# Patient Record
Sex: Female | Born: 1945 | Race: White | Hispanic: No | Marital: Single
Health system: Southern US, Community
[De-identification: ages and names within clinical notes are randomized; demographics above are authoritative.]

---

## 1999-06-09 ENCOUNTER — Other Ambulatory Visit: Admission: RE | Admit: 1999-06-09 | Discharge: 1999-06-09 | Payer: Self-pay | Admitting: Family Medicine

## 1999-08-23 ENCOUNTER — Inpatient Hospital Stay (HOSPITAL_COMMUNITY): Admission: AD | Admit: 1999-08-23 | Discharge: 1999-08-26 | Payer: Self-pay | Admitting: Internal Medicine

## 1999-08-24 ENCOUNTER — Encounter: Payer: Self-pay | Admitting: Internal Medicine

## 2001-08-13 ENCOUNTER — Other Ambulatory Visit: Admission: RE | Admit: 2001-08-13 | Discharge: 2001-08-13 | Payer: Self-pay | Admitting: Family Medicine

## 2001-08-15 ENCOUNTER — Encounter: Admission: RE | Admit: 2001-08-15 | Discharge: 2001-08-15 | Payer: Self-pay | Admitting: Family Medicine

## 2001-08-15 ENCOUNTER — Encounter: Payer: Self-pay | Admitting: Family Medicine

## 2011-12-24 ENCOUNTER — Observation Stay: Payer: Self-pay | Admitting: Internal Medicine

## 2014-02-14 ENCOUNTER — Observation Stay: Payer: Self-pay | Admitting: Internal Medicine

## 2014-02-14 LAB — URINALYSIS, COMPLETE
BACTERIA: NONE SEEN
BILIRUBIN, UR: NEGATIVE
Glucose,UR: >=500 mg/dL (ref 0–75)
Leukocyte Esterase: NEGATIVE
Nitrite: NEGATIVE
Ph: 6 (ref 4.5–8.0)
Protein: 100
RBC,UR: 1 /HPF (ref 0–5)
SPECIFIC GRAVITY: 1.018 (ref 1.003–1.030)
SQUAMOUS EPITHELIAL: NONE SEEN

## 2014-02-14 LAB — COMPREHENSIVE METABOLIC PANEL
Albumin: 3.8 g/dL (ref 3.4–5.0)
Alkaline Phosphatase: 253 U/L — ABNORMAL HIGH
Anion Gap: 10 (ref 7–16)
BUN: 26 mg/dL — ABNORMAL HIGH (ref 7–18)
Bilirubin,Total: 0.7 mg/dL (ref 0.2–1.0)
Calcium, Total: 10 mg/dL (ref 8.5–10.1)
Chloride: 103 mmol/L (ref 98–107)
Co2: 24 mmol/L (ref 21–32)
Creatinine: 1.51 mg/dL — ABNORMAL HIGH (ref 0.60–1.30)
EGFR (African American): 40 — ABNORMAL LOW
EGFR (Non-African Amer.): 35 — ABNORMAL LOW
Glucose: 449 mg/dL — ABNORMAL HIGH (ref 65–99)
Osmolality: 298 (ref 275–301)
Potassium: 3.6 mmol/L (ref 3.5–5.1)
SGOT(AST): 27 U/L (ref 15–37)
SGPT (ALT): 33 U/L (ref 12–78)
Sodium: 137 mmol/L (ref 136–145)
Total Protein: 8 g/dL (ref 6.4–8.2)

## 2014-02-14 LAB — CBC
HCT: 41.7 % (ref 35.0–47.0)
HGB: 13.7 g/dL (ref 12.0–16.0)
MCH: 28.8 pg (ref 26.0–34.0)
MCHC: 32.8 g/dL (ref 32.0–36.0)
MCV: 88 fL (ref 80–100)
Platelet: 212 10*3/uL (ref 150–440)
RBC: 4.74 10*6/uL (ref 3.80–5.20)
RDW: 16.8 % — ABNORMAL HIGH (ref 11.5–14.5)
WBC: 6.6 10*3/uL (ref 3.6–11.0)

## 2014-02-14 LAB — HEMOGLOBIN A1C: Hemoglobin A1C: 9.8 % — ABNORMAL HIGH (ref 4.2–6.3)

## 2014-02-14 LAB — TROPONIN I: Troponin-I: <0.02 ng/mL

## 2014-02-15 LAB — BASIC METABOLIC PANEL
ANION GAP: 5 — AB (ref 7–16)
BUN: 21 mg/dL — ABNORMAL HIGH (ref 7–18)
CHLORIDE: 106 mmol/L (ref 98–107)
CO2: 27 mmol/L (ref 21–32)
CREATININE: 1.13 mg/dL (ref 0.60–1.30)
Calcium, Total: 8.7 mg/dL (ref 8.5–10.1)
EGFR (African American): 57 — ABNORMAL LOW
EGFR (Non-African Amer.): 50 — ABNORMAL LOW
GLUCOSE: 274 mg/dL — AB (ref 65–99)
Osmolality: 288 (ref 275–301)
POTASSIUM: 3.4 mmol/L — AB (ref 3.5–5.1)
Sodium: 138 mmol/L (ref 136–145)

## 2014-02-15 LAB — CBC WITH DIFFERENTIAL/PLATELET
BASOS ABS: 0 10*3/uL (ref 0.0–0.1)
Basophil %: 0.6 %
Eosinophil #: 0.3 10*3/uL (ref 0.0–0.7)
Eosinophil %: 4.2 %
HCT: 34.5 % — AB (ref 35.0–47.0)
HGB: 11.5 g/dL — ABNORMAL LOW (ref 12.0–16.0)
Lymphocyte #: 2.1 10*3/uL (ref 1.0–3.6)
Lymphocyte %: 26.5 %
MCH: 28.7 pg (ref 26.0–34.0)
MCHC: 33.3 g/dL (ref 32.0–36.0)
MCV: 86 fL (ref 80–100)
Monocyte #: 0.6 x10 3/mm (ref 0.2–0.9)
Monocyte %: 7.4 %
NEUTROS ABS: 4.8 10*3/uL (ref 1.4–6.5)
NEUTROS PCT: 61.3 %
PLATELETS: 168 10*3/uL (ref 150–440)
RBC: 3.99 10*6/uL (ref 3.80–5.20)
RDW: 16.3 % — AB (ref 11.5–14.5)
WBC: 7.8 10*3/uL (ref 3.6–11.0)

## 2014-08-05 LAB — COMPREHENSIVE METABOLIC PANEL
Albumin: 3.4 g/dL (ref 3.4–5.0)
Alkaline Phosphatase: 254 U/L — ABNORMAL HIGH
Anion Gap: 13 (ref 7–16)
BUN: 30 mg/dL — ABNORMAL HIGH (ref 7–18)
Bilirubin,Total: 0.4 mg/dL (ref 0.2–1.0)
Calcium, Total: 9.3 mg/dL (ref 8.5–10.1)
Chloride: 91 mmol/L — ABNORMAL LOW (ref 98–107)
Co2: 23 mmol/L (ref 21–32)
Creatinine: 1.92 mg/dL — ABNORMAL HIGH (ref 0.60–1.30)
EGFR (African American): 30 — ABNORMAL LOW
EGFR (Non-African Amer.): 26 — ABNORMAL LOW
Glucose: 584 mg/dL (ref 65–99)
Osmolality: 288 (ref 275–301)
Potassium: 4.6 mmol/L (ref 3.5–5.1)
SGOT(AST): 22 U/L (ref 15–37)
SGPT (ALT): 32 U/L
Sodium: 127 mmol/L — ABNORMAL LOW (ref 136–145)
Total Protein: 7 g/dL (ref 6.4–8.2)

## 2014-08-05 LAB — CBC
HCT: 35.8 % (ref 35.0–47.0)
HGB: 11.6 g/dL — ABNORMAL LOW (ref 12.0–16.0)
MCH: 31.2 pg (ref 26.0–34.0)
MCHC: 32.4 g/dL (ref 32.0–36.0)
MCV: 96 fL (ref 80–100)
Platelet: 237 10*3/uL (ref 150–440)
RBC: 3.72 10*6/uL — ABNORMAL LOW (ref 3.80–5.20)
RDW: 15.7 % — ABNORMAL HIGH (ref 11.5–14.5)
WBC: 6.9 10*3/uL (ref 3.6–11.0)

## 2014-08-05 LAB — URINALYSIS, COMPLETE
Bacteria: NONE SEEN
Bilirubin,UR: NEGATIVE
Glucose,UR: >=500 mg/dL (ref 0–75)
Leukocyte Esterase: NEGATIVE
Nitrite: NEGATIVE
Ph: 5 (ref 4.5–8.0)
Protein: 30
RBC,UR: <1 /HPF (ref 0–5)
Specific Gravity: 1.023 (ref 1.003–1.030)
Squamous Epithelial: NONE SEEN
WBC UR: NONE SEEN /HPF (ref 0–5)

## 2014-08-06 ENCOUNTER — Observation Stay: Payer: Self-pay | Admitting: Specialist

## 2014-08-06 LAB — BASIC METABOLIC PANEL
Anion Gap: 11 (ref 7–16)
BUN: 24 mg/dL — ABNORMAL HIGH (ref 7–18)
Calcium, Total: 8.2 mg/dL — ABNORMAL LOW (ref 8.5–10.1)
Chloride: 103 mmol/L (ref 98–107)
Co2: 22 mmol/L (ref 21–32)
Creatinine: 1.57 mg/dL — ABNORMAL HIGH (ref 0.60–1.30)
EGFR (African American): 38 — ABNORMAL LOW
EGFR (Non-African Amer.): 33 — ABNORMAL LOW
Glucose: 163 mg/dL — ABNORMAL HIGH (ref 65–99)
Osmolality: 280 (ref 275–301)
Potassium: 3.8 mmol/L (ref 3.5–5.1)
Sodium: 136 mmol/L (ref 136–145)

## 2014-08-06 LAB — HEPATIC FUNCTION PANEL A (ARMC)
Albumin: 2.9 g/dL — ABNORMAL LOW (ref 3.4–5.0)
Alkaline Phosphatase: 209 U/L — ABNORMAL HIGH
Bilirubin, Direct: <0.1 mg/dL (ref 0.00–0.20)
Bilirubin,Total: 0.2 mg/dL (ref 0.2–1.0)
SGOT(AST): 21 U/L (ref 15–37)
SGPT (ALT): 24 U/L
Total Protein: 6.4 g/dL (ref 6.4–8.2)

## 2014-10-20 IMAGING — CR DG CHEST 1V PORT
1 series · 1 of 1 positions shown · non-contrast
Comparison: Prior chest x-ray 12/24/2011

CLINICAL DATA: Altered mental status

EXAM:
PORTABLE CHEST - 1 VIEW

[ap]
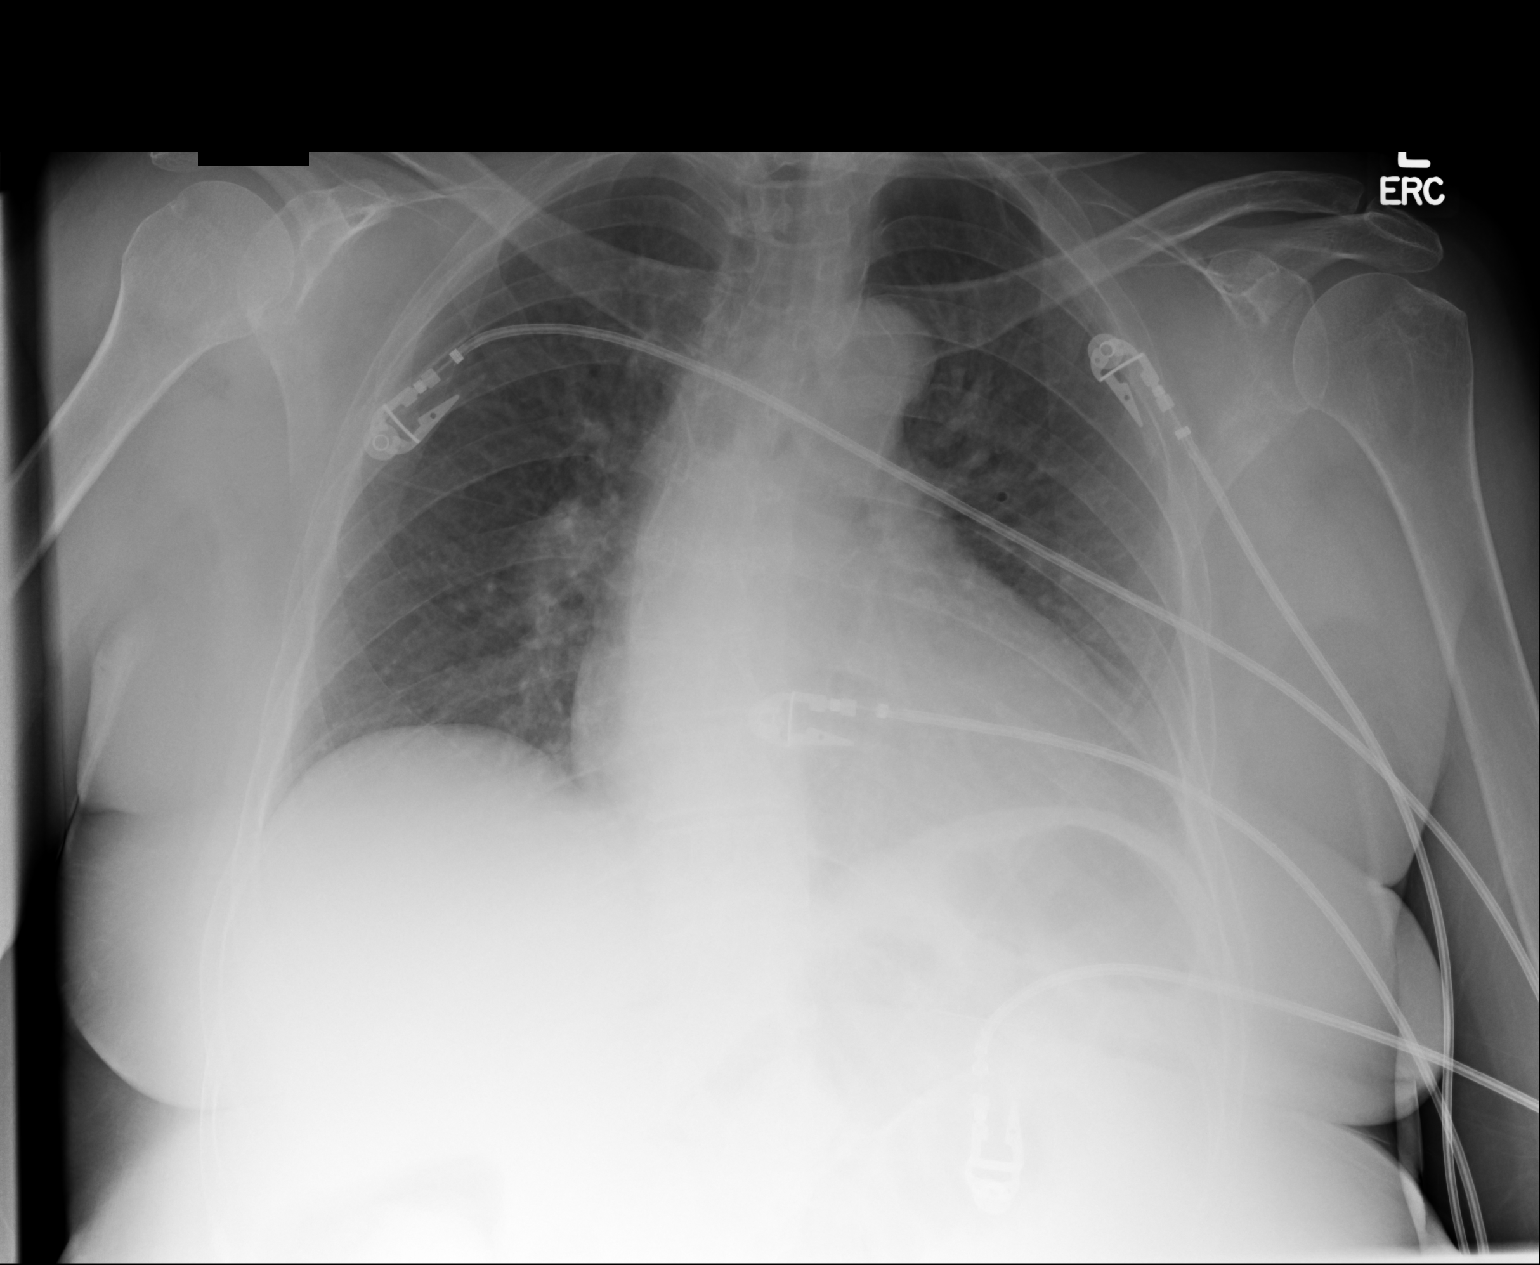

[1 of 1 positions shown; findings below may reference images not displayed]

FINDINGS: Stable cardiomegaly and mediastinal contours. Atherosclerotic
calcification noted in the transverse aorta. Inspiratory volumes are
low. There is mild basilar atelectasis. Stable background bronchitic
changes and interstitial prominence. No focal consolidation, pleural
effusion or pneumothorax. No edema. No acute osseous abnormality.
IMPRESSION: Stable chest x-ray without evidence of acute cardiopulmonary
process.

Cardiomegaly and chronic bronchitic changes.

## 2015-04-23 NOTE — Discharge Summary (Signed)
PATIENT NAME:  [name] [name], Tailor J MR#:  [id number] DATE OF BIRTH:  1945/11/06  DATE OF ADMISSION:  02/14/2014 DATE OF DISCHARGE:  02/15/2014  ADMITTING DIAGNOSIS: Hyperglycemia and altered mental status.   DISCHARGE DIAGNOSES:  1.  Hypoglycemia due to dietary indiscretion, poor diabetes control. The patient's insulin was increased.  2.  Acute encephalopathy felt to be due to a combination of some hyperglycemia and accelerated hypertension with now mental status back to baseline.  3.  Hyperlipidemia.  4.  Mild mental retardation.   CONSULTANTS: None.  LABS: Admitting glucose 449, BUN 26, creatinine 1.51, sodium 137, potassium 3.6, chloride 103, CO2 is 24, calcium is 10. LFTs showed an alkaline phosphatase of 253, otherwise LFTs were normal. WBC 6.6, hemoglobin 13.7, platelet count 212. Creatinine on discharge 1.13. Hemoglobin A1c 9.8. CT scan can of the head without contrast showed no acute intracranial abnormalities and chronic nonspecific white matter changes.   HOSPITAL COURSE: Please refer to H and P done by the admitting physician. The patient is a 70 year old white female with history of mental retardation, hyperlipidemia, diabetes, hypertension, who lives here and in [place], goes back and forth, who is brought in by her sister due to her blood sugars being very high in the 500 range. She was also confused. Due to these symptoms, she was brought to the hospital and she was treated with IV fluids and her insulin was increased. The patient also had accelerated hypertension, which was controlled. With these treatments, her blood pressure normalized. Her blood sugars improved. Her mental status normalized. At discharge, she was at best to baseline and is doing well and is stable for discharge.   DISCHARGE MEDICATIONS: Vitamin D3 1 tab p.o. daily, fish oil 1,000 mg daily, Humalog  sliding scale as written, Tylenol 650 q.4 hours p.r.n. for pain, Lantus 25 units at bedtime, atorvastatin 40 daily,  metoprolol tartrate 100, 1 tab p.o. b.i.d., lisinopril 10 daily and hydrocortisone topically 1% apply to affected area 3 times a day to the arm at the rash.   DIET: Low-sodium, low-fat, low-cholesterol diet.   ACTIVITY: As tolerated.   FOLLOWUP: With primary M.D. in 1 to 2 weeks. The patient is to keep a log of blood pressure and blood glucose to take to primary M.D.   TIME SPENT: 35 minutes spent on this discharge.   ____________________________ Lacie ScottsShreyang H. Allena KatzPatel, MD shp:aw D: 02/16/2014 08:49:57 ET T: 02/16/2014 08:57:01 ET JOB#: [id number]  cc: Harleigh Civello H. Allena KatzPatel, MD, <Dictator> Charise CarwinSHREYANG H Kaydince Towles MD ELECTRONICALLY SIGNED 02/19/2014 14:34

## 2015-04-23 NOTE — H&P (Signed)
PATIENT NAME:  [name], [name] MR#:  [id number] DATE OF BIRTH:  Dec 28, 1945  DATE OF ADMISSION:  02/13/2014  REFERRING PHYSICIAN: Dr. Delman Kitten.    PRIMARY CARE PHYSICIAN: The patient is from [place]; visiting briefly here.   CHIEF COMPLAINT: Hyperglycemia.   HISTORY OF PRESENT ILLNESS: This is a 70 year old female with significant past medical history of mild mental retardation, hyperlipidemia, diabetes, hypertension. The patient lives in [place] with her sister. She visits frequently as she has her parents' home here.  EMS were called by her sister, as the patient had uncontrolled blood sugar. The patient is a poor historian, history was obtained from the sister over the phone, Ms. [name] [name], phone number [phone], reports she noticed her sister's blood sugar elevated at home, so she called EMS who asked her to give her 15 units subcutaneous of NovoLog, as well sister reports her sister has been confused, and reports she usually gets confused whenever her blood sugar is uncontrolled. Upon presentation to ED, fingersticks were 514 on BMP 12:04:49. The patient had CT head without contrast to evaluate for confusion, which did not show any acute findings. His sister denies patient having any focal deficits or slurred speech or as well denies her having syncope. as it was mentioned earlier by some ED documentation. Hospitalist service were requested to admit the patient for control of her hyperglycemia. The patient received 1 liter of normal saline, but did not receive any insulin so far in ED. As well, patient's blood pressure was noticed to be uncontrolled with systolic blood pressure being in the low 200s. As well, as sister reports patient has been on cephalexin for the last few days for left hand cellulitis, as well reports it has been improving. She denies any fever, chills, cough, diarrhea for the patient.   REVIEW OF SYSTEMS:  The patient is a very poor historian, cannot give any reliable  review of systems but she denies any complaints.  Review of systems cannot be obtained from this patient as she is confused.   PAST MEDICAL HISTORY: 1.  Diabetes mellitus.  2.  Hyperlipidemia.  3.  Hypertension.  4.  Mild mental retardation.   PAST SURGICAL HISTORY: None.   ALLERGIES: ASPIRIN.    HOME MEDICATIONS: 1.  NovoLog 5 units 3 times a day before meals.  2.  Lantus 14 units subcutaneous at bedtime.  3.  Lisinopril 10 mg daily.  4.  Atorvastatin 80 mg at bedtime.  5.  Has been on cephalexin 500 mg oral 3 times a day for last few days for left arm cellulitis.  6.  Ranitidine 150 mg oral daily.  7.  Metoprolol 50 mg oral 2 times a day.    SOCIAL HISTORY: The patient lives with her sister in [place], visiting frequently [place]. No history of smoking or alcohol.   FAMILY HISTORY: Significant for CVA in both parents.    PHYSICAL EXAMINATION: VITAL SIGNS: Temperature 97.6, pulse 72, respiratory rate 18, blood pressure 209/90, saturating at 96% on room air.  GENERAL: Well-nourished female who looks comfortable in bed, in no apparent distress.  HEENT: Head atraumatic, normocephalic. Pupils are equal and reactive to light. Pink conjunctivae. Anicteric sclerae. Dry oral mucosa.  NECK: Supple. No thyromegaly. No JVD.  CHEST: Good air entry bilaterally. No wheezing, rales, rhonchi.  CARDIOVASCULAR: S1, S2 heard. No rubs, murmur or gallops.  ABDOMEN: Soft, nontender, nondistended. Bowel sounds present.  EXTREMITIES: No edema. No clubbing. No cyanosis. Pedal pulses +2 bilaterally.  PSYCHIATRIC: The  patient is awake, communicative, but confused, only oriented to name.  NEUROLOGIC: Cranial nerves grossly intact.  MOTOR: The patient is noncompliant with physical exam, but appears to be moving all extremities without significant deficits.   MUSCULOSKELETAL: No joint effusion or erythema.  SKIN: Dry, delayed skin turgor. Having left arm cellulitis in the antecubital area.   LYMPHATICS: No joint effusion or erythema.   PERTINENT LABORATORY DATA: Glucose 449, BUN 26, creatinine 1.51, sodium 137, potassium 3.6, chloride 113, CO2 24. Troponin less than 0.02. ALT 33, AST 27, alk phos 253. White blood cells 6.6, hemoglobin 13.7, hematocrit 41.7, platelets 212. ABG showing pH 7.39, pCO2 of 38 and pO2 of 96.   ASSESSMENT AND PLAN: 1.  Altered mental status/encephalopathy. The patient has no acute finding, has a gradual improvement of her mental status since she has been in ED, this is most likely related to her hyperglycemia. Her sister reports she is usually like that that when her blood sugar is uncontrolled, as well will check her urinalysis to rule out urinary tract infection.  2.  Uncontrolled diabetes mellitus. The patient appears to be dehydrated. Will be getting IV fluids. Currently, she is on her second liter, had significant improvement of her blood sugar with hydration; last reading is 342, will give her evening dose Lantus which she did not receive yet.  We will give start her on insulin sliding scale. We will give her one dose of 6 units of NovoLog subcutaneous. We will check her fingersticks every two hours x 3.  3.  Hypertension, uncontrolled. We will add p.r.n. hydralazine. We will give her full dose IV now. We will resume her back on her home medications, including metoprolol and lisinopril.  4.  Hyperlipidemia. Continue with statin.  5.  Left arm cellulitis. Continue with Keflex. The patient is afebrile, has no leukocytosis.  6.  Gastroesophageal reflux disease. Continue with ranitidine.   7.  Deep vein thrombosis prophylaxis. Subcutaneous heparin.  8.  CODE STATUS: Discussed with sister who reports the patient is full code.   Total time spent on admission and patient care: 50 minutes.    ____________________________ Albertine Patricia, MD dse:NTS D: 02/14/2014 03:48:50 ET T: 02/14/2014 04:36:58 ET JOB#: [id number]  cc: Albertine Patricia, MD,  <Dictator> [name] Graciela Husbands MD ELECTRONICALLY SIGNED 02/16/2014 3:08

## 2015-04-23 NOTE — H&P (Signed)
PATIENT NAME:  [name] [name], [name] [name] MR#:  [id number] DATE OF BIRTH:  Jan 24, 1945  DATE OF ADMISSION:  08/06/2014  REFERRING PHYSICIAN: Dr. Sharyn Creamer.  PRIMARY CARE PHYSICIAN: The patient is back and forth from [place]. Her physician is in [place].   CHIEF COMPLAINT: Hyperglycemia.   HISTORY OF PRESENT ILLNESS: This is a 70 year old female with known history of mild mental retardation, hyperlipidemia, diabetes and hypertension, lives with her sister. The patient presents today as she had elevated blood sugar. The patient run out of her insulin syringes today,  as her sister who is helping to take care of her they do not know where they put the syringes as they are moving back and forth from [place]. She did not take any of her insulin today. The finger was too high at home, which prompted the sister to bring her to the ED. The patient's  sugar upon presentation was elevated at 558. She received 8 units of NovoLog, then it dropped to 472 then she received another 8 where it dropped to 279. The patient was noticed to be in acute renal failure with a creatinine of 1.92, where it is normal at baseline. She had episode of hyponatremia with sodium of 127 as well. Urinalysis was negative for urinary tract infection. Hospitalist service was requested to admit the patient given her acute renal failure and uncontrolled blood sugar. The patient was noticed to have some erythema and tenderness in the right lower extremity related to cellulitis.   PAST MEDICAL HISTORY:  1. Diabetes mellitus.  2. Hyperlipidemia.  3. Hypertension.  4. Mild mental retardation.   REVIEW OF SYSTEMS: The patient is a very poor historian, cannot give any reliable review of systems review of systems.   PAST SURGICAL HISTORY: None.   ALLERGIES: ASPIRIN.   HOME MEDICATIONS:  1. Insulin sliding scale 3 times a day.  2. Lantus 25 units subcutaneous at bedtime.  3. Lisinopril 10 mg oral daily.  4. Atorvastatin 80 mg at bedtime.  5.  Ranitidine 150 mg oral 2 times a day.  6. Reglan 5 mg oral 4 times a day.  7. Loratadine 10 mg oral daily.  8. Metoprolol 100 mg oral 2 times a day.   SOCIAL HISTORY: The patient lives with her sister. Her sister helps to take care of her, she is 70 years old, and they are back and forth from [place] planning to settle in [place] by the end of the month. No history of smoking or alcohol use.   FAMILY HISTORY: Significant for CVA in both parents.   PHYSICAL EXAMINATION:  VITAL SIGNS: Temperature 98.8, pulse 86, respiratory rate 24, blood pressure 148/55, saturating 99% on room air.  GENERAL: Well-nourished female who looks comfortable in bed, in no apparent distress.  HEENT: Head is atraumatic and normocephalic. Pupils equal, round and reactive to light. Pink conjunctivae. Anicteric sclerae. Dry oral mucosa.  NECK: Supple. No thyromegaly. No JVD. No carotid bruits.  CHEST: Good air entry bilaterally. No wheezing, rales or rhonchi. No accessory muscle use.  CARDIOVASCULAR: S1, S2 heard. No rubs, murmurs, gallops. PMI nondisplaced.  ABDOMEN: Soft, nontender, nondistended. Bowel sounds present.  EXTREMITIES: No edema. No clubbing. No cyanosis. Pedal pulses +2 bilaterally. Has mild erythema in the right lower extremity.  PSYCHIATRIC: The patient is awake, alert, pleasant, communicative, oriented to name.  NEUROLOGIC: Grossly intact. Motor 5 out of 5. Moves all extremities without any significant deficits.  MUSCULOSKELETAL: No joint effusion or erythema.  SKIN: Normal skin turgor. Warm and  dry. Has right lower extremity cellulitis.  LYMPHATICS: No cervical or supraclavicular lymphadenopathy.   PERTINENT LABORATORY DATA: Glucose 584, BUN 30, creatinine 1.92, sodium 127, potassium 4.6, chloride 91, CO2 23. ALT 32, AST 22, alkaline phosphatase 254. White blood cells 6.9, hemoglobin 11.6, hematocrit 35.8, platelets 237,000. Urinalysis negative for nitrite and leukocyte esterase,   ASSESSMENT AND  PLAN:  1. Uncontrolled diabetes mellitus and hyperglycemia, this is as the patient was not taking her medications over the last 24 hours as she does not have syringes. The patient will be resumed back on her Lantus, will be hydrated, will get her back on insulin sliding scale.  2. Acute renal failure. This is most likely due to dehydration and volume depletion related to  hyperglycemia. We will hold her lisinopril. We will hydrate. We will recheck in a.m.  3. Elevated alkaline phosphatase. We will recheck after hydration if no improvement. We will check Gamma-glutamyl transpeptidase.   4. Pseudohyponatremia.  5. Hyperlipidemia. Continue with statin.  6. Hypertension. Blood pressure acceptable. Continue with metoprolol. We will hold lisinopril given her renal failure.  7. Mild mental retardation, continue with supportive care, we will request case management consult to see if the family has any needs as the sister is elderly and might need some assistance to take care of the patient.  8. Deep vein thrombosis prophylaxis. Subcutaneous heparin.   CODE STATUS: Full code.   TOTAL TIME SPENT ON ADMISSION AND PATIENT CARE: 55 minutes.     ____________________________ Starleen Armsawood S. Dovie Kapusta, MD dse:jh D: 08/06/2014 01:06:40 ET T: 08/06/2014 01:49:56 ET JOB#: [id number]  cc: Starleen Armsawood S. Ryann Leavitt, MD, <Dictator> Amine Adelson Teena IraniS Sriram Febles MD ELECTRONICALLY SIGNED 08/06/2014 7:35

## 2015-04-23 NOTE — Discharge Summary (Signed)
PATIENT NAME:  [name] [name], [name] [name] MR#:  [id number] DATE OF BIRTH:  Sep 01, 1945  DATE OF ADMISSION:  08/06/2014 DATE OF DISCHARGE:  08/06/2014  For a detailed note, please see history and physical done on admission by Dr. Randol KernElgergawy.   DIAGNOSES AT DISCHARGE: As follows:  1. Hyperglycemia/uncontrolled diabetes. 2. Hypertension.  3. Acute renal failure.  4. Hyperlipidemia.  5. Chronic pain.  6. Eczema.   The patient is being discharged on a low-sodium, American diabetic Association diet, low-fat diet.   ACTIVITY: As tolerated. Follow-up Dr. Angelique HolmEdward Lance in the next 1 to 2 weeks.   DISCHARGE MEDICATIONS:  Lantus 25 units at bedtime, lisinopril 10 mg daily, atorvastatin 80 mg daily, Humalog sliding scale t.i.d. with meals, Tylenol with hydrocodone 1 tablet  q. 6 hours as needed, Reglan 5 mg q.i.d. before meals and bedtime,  ranitidine 150 mg b.i.d., metoprolol tartrate 50 mg b.i.d., loratadine 10 mg daily, hydrocortisone topical 1% to be applied b.i.d. x 10 days.   PERTINENT STUDIES DONE DURING THE HOSPITAL COURSE: Are as follows: None.   BRIEF HOSPITAL COURSE: This is a 70 year old female with medical problems as mentioned above, presented to the hospital due to significantly elevated blood sugars.   1. Uncontrolled diabetes/hyperglycemia. The most likely cause of this was the fact that the patient actually had run out of her insulin syringes and needles. They had just recently moved from [place] and she could not find her syringes and needles so she could not give herself the insulin. She presented to the hospital with blood sugars significantly elevated at over 500. The patient was given aggressive intravenous fluids, resumed back on her insulin and her blood sugars have significantly improved. At this point patient has no nausea, no vomiting. No evidence of any acute diabetic ketoacidosis or any evidence of nonketotic hyperosmolar state. She is therefore being discharged home on her baseline  Levemir and sliding scale insulin with meals.  2. Acute renal failure. The patient presented to the hospital with creatinine of 1.9. This was likely secondary to hyperglycemia and dehydration. The patient was aggressively hydrated with intravenous fluids and her renal function has significantly improved and back to baseline now. Her ACE inhibitor was held, although she will resume that upon discharge. 3. Hypertension. The patient remained hemodynamically stable. Her lisinopril was held due to acute renal failure, but she will resume that along with her metoprolol.  4. Hyperlipidemia. The patient was maintained on atorvastatin. She will resume that. 5. Chronic pain. The patient was maintained on her Norco, and she will also resume that.  6. Eczema. The patient was noted to have an eczematous rash on her right lower extremity along with on her upper extremities bilaterally. I am discharging her on some hydrocortisone cream to be applied b.i.d. x 10 days and she can use Benadryl as needed for the itching.   CODE STATUS: The patient is a full code.  TIME SPENT ON DISCHARGE: 40 minutes.    ____________________________ Rolly PancakeVivek [name]. Cherlynn KaiserSainani, MD vjs:jh D: 08/06/2014 14:56:41 ET T: 08/07/2014 02:27:06 ET JOB#: [id number]  cc: Rolly PancakeVivek [name]. Cherlynn KaiserSainani, MD, <Dictator> Dr. Angelique HolmEdward Lance Houston SirenVIVEK [name] Dotty Gonzalo MD ELECTRONICALLY SIGNED 08/13/2014 14:29

## 2015-04-24 NOTE — Discharge Summary (Signed)
PATIENT NAME:  [name] [name], [name] [name] MR#:  [id number] DATE OF BIRTH:  04/01/1945  DATE OF ADMISSION:  12/24/2011 DATE OF DISCHARGE:  12/25/2011  ADMITTING DIAGNOSIS: Altered mental status, hypoglycemia.   DISCHARGE DIAGNOSES:  1. Acute encephalopathy due to hypoglycemia, now mental status back to normal.  2. Hypoglycemia possibly due to the patient taking accidentally more insulin than she should have. We will decrease her insulin dose and give her detailed instructions on a sliding scale.  3. 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 Now blood pressure is improved. She is chronically on beta blocker. We will continue that.  4. Hyperlipidemia. 5. Recurrent headaches.   PERTINENT LABS/STUDIES: Admitting WBC 7.6, hemoglobin 12.4, and platelet count 193. Sodium 143, potassium 3.8, chloride 106, bicarbonate 30, BUN 15, creatinine 1, glucose 38, calcium 9.3, ALT 32, AST 27, alkaline phosphatase 107, total bilirubin 0.4, and albumin 3.9.   Urinalysis: Negative for leukocytes and nitrites.   Chest x-ray: Interstitial prominence. No focal consolidation.   CT of head without contrast: Changes typical of chronic small vessel changes.   EKG: Sinus bradycardia.   HOSPITAL COURSE: The patient is a 70 year old white female with past medical history of insulin-dependent diabetes and hyperlipidemia who comes from home secondary to a syncopal episode and confusion due to low blood sugars. The patient essentially passed out at home. EMS was called. She had a blood sugar of 32. The patient was confused. When EMS got there they gave her D50 and then she was seen in the ED and placed on a D5 drip. Her blood sugars yesterday were 455, 252 and then 71 and 58. Early starting this morning her sugars have increased and they are 115 and 97 and the last one was 217. The patient is feeling much better and is stable for discharge. There is a concern that she might have taken extra insulin than she was  supposed to. At this time, I have recommended that she decrease her dose of insulin. If she has recurrent episodes of this then further evaluation needs to be done. She normally lives in [place] and has an endocrinologist who follows her closely.   DISCHARGE MEDICATIONS:  1. Metoprolol tartrate 50 mg one tab p.o. daily.  2. Vitamin D3 1 tab p.o. daily at home dose. 3. Fish oil 1000 mg 1 tab p.o. daily. 4. Continue Humalog sliding scale; 4 units if blood glucose is greater than 300, 6 units if blood glucose is greater than 400, 8 units of blood glucose is greater than 500, and if greater than 600 she needs to take 10 units and call primary MD.  5. Decrease Lantus dose to 9 units in the a.m. and 7 units at bedtime.   DISCHARGE INSTRUCTIONS/FOLLOWUP: I also recommended to the patient to eat a low sodium, ADA diet. She is to see her primary doctor/endocrinologist in 1 to 2 weeks. The patient is to keep a log of blood glucose to take to her primary MD or endocrinologist. She was told to make sure she takes only the stated dose of insulin. She is encouraged to eat at least three meals per day and be consistent with her intake.   TIME SPENT ON DISCHARGE: 35 minutes. ____________________________ Lacie ScottsShreyang H. Allena KatzPatel, MD shp:slb D: 12/25/2011 11:23:09 ET T: 12/26/2011 13:30:51 ET JOB#: [id number]  cc: Reshma Hoey H. Allena KatzPatel, MD, <Dictator> Charise CarwinSHREYANG H Diondre Pulis MD ELECTRONICALLY SIGNED 01/05/2012 15:06

## 2015-07-01 DEATH — deceased
